# Patient Record
Sex: Male | Born: 2000 | Race: White | Hispanic: No | Marital: Single | Smoking: Never smoker
Health system: Southern US, Community
[De-identification: ages and names within clinical notes are randomized; demographics above are authoritative.]

## PROBLEM LIST (undated history)

## (undated) HISTORY — PX: TONSILLECTOMY: SUR1361

---
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
## 2019-08-18 ENCOUNTER — Other Ambulatory Visit: Payer: Self-pay

## 2019-08-18 DIAGNOSIS — Z20822 Contact with and (suspected) exposure to covid-19: Secondary | ICD-10-CM

## 2019-08-20 LAB — NOVEL CORONAVIRUS, NAA: SARS-CoV-2, NAA: DETECTED — AB

## 2019-08-26 ENCOUNTER — Telehealth: Payer: Self-pay | Admitting: *Deleted

## 2019-08-26 NOTE — Telephone Encounter (Signed)
Returning NT all regarding positive Covid19 results from  12/01. Reporting he has been isolating now without fever/respiratory sxs and at the end of his isolation time. Discussed to continue to monitor for sxs and call your doctor if any arise. Encouraged fluids and rest during this time.

## 2021-11-07 ENCOUNTER — Other Ambulatory Visit: Payer: Self-pay

## 2021-11-07 ENCOUNTER — Ambulatory Visit
Admission: EM | Admit: 2021-11-07 | Discharge: 2021-11-07 | Disposition: A | Discharge status: Home / Self Care | Payer: BC Managed Care – PPO | Attending: Emergency Medicine | Admitting: Emergency Medicine

## 2021-11-07 DIAGNOSIS — J069 Acute upper respiratory infection, unspecified: Secondary | ICD-10-CM | POA: Diagnosis not present

## 2021-11-07 MED ORDER — PROMETHAZINE-DM 6.25-15 MG/5ML PO SYRP: 5.0000 mL | ORAL_SOLUTION | Freq: Four times a day (QID) | ORAL | 0 refills | Status: AC | PRN

## 2021-11-07 MED ORDER — IPRATROPIUM BROMIDE 0.06 % NA SOLN: 2.0000 | Freq: Four times a day (QID) | NASAL | 12 refills | Status: AC

## 2021-11-07 MED ORDER — AMOXICILLIN-POT CLAVULANATE 875-125 MG PO TABS: 1.0000 | ORAL_TABLET | Freq: Two times a day (BID) | ORAL | 0 refills | Status: AC

## 2021-11-07 MED ORDER — BENZONATATE 100 MG PO CAPS: 200.0000 mg | ORAL_CAPSULE | Freq: Three times a day (TID) | ORAL | 0 refills | Status: AC

## 2021-11-07 NOTE — Discharge Instructions (Signed)
Take the Augment twice daily for 10 days with food for treatment of yuour URI  Use the Atrovent nasal spray, 2 squirts in each nostril every 6 hours, as needed for runny nose and postnasal drip.  Use the Tessalon Perles every 8 hours during the day.  Take them with a small sip of water.  They may give you some numbness to the base of your tongue or a metallic taste in your mouth, this is normal.  Use the Promethazine DM cough syrup at bedtime for cough and congestion.  It will make you drowsy so do not take it during the day.  Use OTC Tylenol and Ibuprofen as needed for headache.  Return for reevaluation or see your primary care provider for any new or worsening symptoms.

## 2021-11-07 NOTE — ED Triage Notes (Signed)
Patient presents to Urgent Care with complaints of headache, SOB, and fever since last week. He states he did have nasal congestion and sore throat but those symptoms have resolved. Mom was concerned with 02 status reports 02 reading from 02 pulse oximetry was 90%. Treating symptoms with ibuprofen. Pt had 2 negative covid tests.

## 2021-11-07 NOTE — ED Provider Notes (Signed)
[facility]    CSN: [id number] Arrival date & time: 11/07/21  1333      History   Chief Complaint Chief Complaint  Patient presents with   Headache   Fever    HPI Billy Floyd is a 21 y.o. male.   HPI  21 year old male here for evaluation of respiratory complaints.  Patient is with his mother for evaluation of 8 days worth of biparietal headache, subjective fever, runny nose nasal congestion for yellow nasal discharge, intermittently productive cough, and shortness of breath.  He denies ear pain or wheezing.  His mom was concerned because last night she checked his SPO2 with a borrowed pulse oximeter and she was getting readings of 90% so she would recommend to the urgent care today for evaluation.  Patient has been using ibuprofen for his headaches and reports that it has been helping.  He is also been using 24-hour Mucinex to help relieve his congestion without significant improvement.  Patient was tested at home for COVID at the onset of symptoms and then again last night, both which were negative.  History reviewed. No pertinent past medical history.  There are no problems to display for this patient.   Past Surgical History:  Procedure Laterality Date   TONSILLECTOMY         Home Medications    Prior to Admission medications   Medication Sig Start Date End Date Taking? Authorizing Provider  amoxicillin-clavulanate (AUGMENTIN) 875-125 MG tablet Take 1 tablet by mouth every 12 (twelve) hours for 10 days. 11/07/21 11/17/21 Yes Becky Augusta, NP  benzonatate (TESSALON) 100 MG capsule Take 2 capsules (200 mg total) by mouth every 8 (eight) hours. 11/07/21  Yes Becky Augusta, NP  ipratropium (ATROVENT) 0.06 % nasal spray Place 2 sprays into both nostrils 4 (four) times daily. 11/07/21  Yes Becky Augusta, NP  promethazine-dextromethorphan (PROMETHAZINE-DM) 6.25-15 MG/5ML syrup Take 5 mLs by mouth 4 (four) times daily as needed. 11/07/21  Yes Becky Augusta, NP     Family History History reviewed. No pertinent family history.  Social History Social History   Tobacco Use   Smoking status: Never   Smokeless tobacco: Never  Vaping Use   Vaping Use: Never used  Substance Use Topics   Alcohol use: Never   Drug use: Never     Allergies   Patient has no known allergies.   Review of Systems Review of Systems  Constitutional:  Negative for fever.  HENT:  Positive for congestion, rhinorrhea and sore throat. Negative for ear pain.   Respiratory:  Positive for cough and shortness of breath. Negative for wheezing.   Skin:  Negative for rash.  Hematological: Negative.   Psychiatric/Behavioral: Negative.      Physical Exam Triage Vital Signs ED Triage Vitals [11/07/21 1506]  Enc Vitals Group     BP 110/68     Pulse Rate 87     Resp      Temp 98.2 F (36.8 C)     Temp Source Oral     SpO2 98 %     Weight      Height      Head Circumference      Peak Flow      Pain Score      Pain Loc      Pain Edu?      Excl. in GC?    No data found.  Updated Vital Signs BP 110/68 (BP Location: Right Arm)    Pulse 87  Temp 98.2 F (36.8 C) (Oral)    SpO2 98%   Visual Acuity Right Eye Distance:   Left Eye Distance:   Bilateral Distance:    Right Eye Near:   Left Eye Near:    Bilateral Near:     Physical Exam Vitals and nursing note reviewed.  Constitutional:      Appearance: Normal appearance. He is not ill-appearing.  HENT:     Head: Normocephalic and atraumatic.     Right Ear: Tympanic membrane, ear canal and external ear normal. There is no impacted cerumen.     Left Ear: Tympanic membrane, ear canal and external ear normal. There is no impacted cerumen.     Nose: Congestion and rhinorrhea present.     Mouth/Throat:     Mouth: Mucous membranes are moist.     Pharynx: Oropharynx is clear. Posterior oropharyngeal erythema present.  Cardiovascular:     Rate and Rhythm: Normal rate and regular rhythm.     Pulses: Normal  pulses.     Heart sounds: Normal heart sounds. No murmur heard.   No friction rub. No gallop.  Pulmonary:     Effort: Pulmonary effort is normal.     Breath sounds: Normal breath sounds. No wheezing, rhonchi or rales.  Musculoskeletal:     Cervical back: Normal range of motion and neck supple.  Lymphadenopathy:     Cervical: No cervical adenopathy.  Skin:    General: Skin is warm and dry.     Capillary Refill: Capillary refill takes less than 2 seconds.     Findings: No erythema or rash.  Neurological:     General: No focal deficit present.     Mental Status: He is alert and oriented to person, place, and time.  Psychiatric:        Mood and Affect: Mood normal.        Behavior: Behavior normal.        Thought Content: Thought content normal.        Judgment: Judgment normal.     UC Treatments / Results  Labs (all labs ordered are listed, but only abnormal results are displayed) Labs Reviewed - No data to display  EKG   Radiology No results found.  Procedures Procedures (including critical care time)  Medications Ordered in UC Medications - No data to display  Initial Impression / Assessment and Plan / UC Course  I have reviewed the triage vital signs and the nursing notes.  Pertinent labs & imaging results that were available during my care of the patient were reviewed by me and considered in my medical decision making (see chart for details).  Patient is a nontoxic-appearing 21 year old male here for evaluation of respiratory complaints as outlined HPI above.  On physical exam he has pearly-gray tympanic membranes bilaterally with normal light reflex and clear external auditory canals.  Nasal mucosa is erythematous and edematous with thick purulent yellow discharge in both nares.  Oropharyngeal exam reveals mild posterior oropharyngeal erythema.  Tonsillar pillars are surgically absent.  No cervical lymphadenopathy appreciated exam.  Cardiopulmonary exam reveals clear  lung sounds in all fields.  Patient's exam is consistent with an upper respiratory infection.  Advised his mom that he very well could have had COVID and is test 8 days ago could have been a false negative.  Yesterday his test could have been negative because he was outside the window for infection given the [place] variant.  In any event, given his 8 days of symptoms  and continued fever I will do a trial of antibiotics with Augmentin twice daily for 10 days.  I will also prescribe Atrovent nasal spray to up the nasal congestion, Tessalon Perles help with cough during the day, and Promethazine DM help with cough at bedtime.  Tylenol and ibuprofen as needed for fever and pain.   Final Clinical Impressions(s) / UC Diagnoses   Final diagnoses:  Upper respiratory tract infection, unspecified type     Discharge Instructions      Take the Augment twice daily for 10 days with food for treatment of yuour URI  Use the Atrovent nasal spray, 2 squirts in each nostril every 6 hours, as needed for runny nose and postnasal drip.  Use the Tessalon Perles every 8 hours during the day.  Take them with a small sip of water.  They may give you some numbness to the base of your tongue or a metallic taste in your mouth, this is normal.  Use the Promethazine DM cough syrup at bedtime for cough and congestion.  It will make you drowsy so do not take it during the day.  Use OTC Tylenol and Ibuprofen as needed for headache.  Return for reevaluation or see your primary care provider for any new or worsening symptoms.      ED Prescriptions     Medication Sig Dispense Auth. Provider   amoxicillin-clavulanate (AUGMENTIN) 875-125 MG tablet Take 1 tablet by mouth every 12 (twelve) hours for 10 days. 20 tablet Becky Augusta, NP   ipratropium (ATROVENT) 0.06 % nasal spray Place 2 sprays into both nostrils 4 (four) times daily. 15 mL Becky Augusta, NP   benzonatate (TESSALON) 100 MG capsule Take 2 capsules (200 mg total)  by mouth every 8 (eight) hours. 21 capsule Becky Augusta, NP   promethazine-dextromethorphan (PROMETHAZINE-DM) 6.25-15 MG/5ML syrup Take 5 mLs by mouth 4 (four) times daily as needed. 118 mL Becky Augusta, NP      PDMP not reviewed this encounter.   Becky Augusta, NP 11/07/21 [phone]

## 2021-11-08 ENCOUNTER — Ambulatory Visit: Payer: Self-pay

## 2022-09-17 DIAGNOSIS — Z419 Encounter for procedure for purposes other than remedying health state, unspecified: Secondary | ICD-10-CM | POA: Diagnosis not present

## 2022-10-18 DIAGNOSIS — Z419 Encounter for procedure for purposes other than remedying health state, unspecified: Secondary | ICD-10-CM | POA: Diagnosis not present

## 2022-11-16 DIAGNOSIS — Z419 Encounter for procedure for purposes other than remedying health state, unspecified: Secondary | ICD-10-CM | POA: Diagnosis not present

## 2022-12-17 DIAGNOSIS — Z419 Encounter for procedure for purposes other than remedying health state, unspecified: Secondary | ICD-10-CM | POA: Diagnosis not present

## 2023-01-16 DIAGNOSIS — Z419 Encounter for procedure for purposes other than remedying health state, unspecified: Secondary | ICD-10-CM | POA: Diagnosis not present

## 2023-02-16 DIAGNOSIS — Z419 Encounter for procedure for purposes other than remedying health state, unspecified: Secondary | ICD-10-CM | POA: Diagnosis not present

## 2023-03-18 DIAGNOSIS — Z419 Encounter for procedure for purposes other than remedying health state, unspecified: Secondary | ICD-10-CM | POA: Diagnosis not present

## 2023-05-19 DIAGNOSIS — Z419 Encounter for procedure for purposes other than remedying health state, unspecified: Secondary | ICD-10-CM | POA: Diagnosis not present

## 2023-06-18 DIAGNOSIS — Z419 Encounter for procedure for purposes other than remedying health state, unspecified: Secondary | ICD-10-CM | POA: Diagnosis not present

## 2023-07-19 DIAGNOSIS — Z419 Encounter for procedure for purposes other than remedying health state, unspecified: Secondary | ICD-10-CM | POA: Diagnosis not present

## 2023-08-18 DIAGNOSIS — Z419 Encounter for procedure for purposes other than remedying health state, unspecified: Secondary | ICD-10-CM | POA: Diagnosis not present

## 2023-09-18 DIAGNOSIS — Z419 Encounter for procedure for purposes other than remedying health state, unspecified: Secondary | ICD-10-CM | POA: Diagnosis not present

## 2023-10-19 DIAGNOSIS — Z419 Encounter for procedure for purposes other than remedying health state, unspecified: Secondary | ICD-10-CM | POA: Diagnosis not present

## 2023-11-16 DIAGNOSIS — Z419 Encounter for procedure for purposes other than remedying health state, unspecified: Secondary | ICD-10-CM | POA: Diagnosis not present

## 2023-12-28 DIAGNOSIS — Z419 Encounter for procedure for purposes other than remedying health state, unspecified: Secondary | ICD-10-CM | POA: Diagnosis not present

## 2023-12-30 ENCOUNTER — Ambulatory Visit: Admitting: Family Medicine
